# Patient Record
Sex: Female | Born: 2000 | Race: Black or African American | Hispanic: No | Marital: Single | Smoking: Never smoker
Health system: Southern US, Community
[De-identification: ages and names within clinical notes are randomized; demographics above are authoritative.]

## PROBLEM LIST (undated history)

## (undated) ENCOUNTER — Inpatient Hospital Stay (HOSPITAL_COMMUNITY): Payer: Self-pay

## (undated) DIAGNOSIS — H539 Unspecified visual disturbance: Secondary | ICD-10-CM

## (undated) DIAGNOSIS — F913 Oppositional defiant disorder: Secondary | ICD-10-CM

## (undated) DIAGNOSIS — F909 Attention-deficit hyperactivity disorder, unspecified type: Secondary | ICD-10-CM

## (undated) DIAGNOSIS — T7840XA Allergy, unspecified, initial encounter: Secondary | ICD-10-CM

## (undated) HISTORY — PX: WISDOM TOOTH EXTRACTION: SHX21

---
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
## 2022-02-10 ENCOUNTER — Ambulatory Visit (INDEPENDENT_AMBULATORY_CARE_PROVIDER_SITE_OTHER): Payer: Medicaid Other | Admitting: Pediatrics

## 2022-02-10 ENCOUNTER — Encounter: Payer: Self-pay | Admitting: Pediatrics

## 2022-02-10 VITALS — Wt 130.6 lb

## 2022-02-10 DIAGNOSIS — Z7187 Encounter for pediatric-to-adult transition counseling: Secondary | ICD-10-CM

## 2022-02-10 DIAGNOSIS — Z111 Encounter for screening for respiratory tuberculosis: Secondary | ICD-10-CM

## 2022-02-10 DIAGNOSIS — Z7189 Other specified counseling: Secondary | ICD-10-CM
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
## 2022-02-10 NOTE — Patient Instructions (Addendum)
Come back Monday morning to read PPD result.  Please select a new doctor in adult primary care for your upcoming check-ups and health needs.  Here is a list for guidance; however, other doctors may be available if you call them.  Please call the new doctor within the next 30 days to set up appointment; we will follow up with you to see if this has gone well.  Adult Primary Care Clinics Name Criteria Services   [facility]  Address: [address] [place], [place] [address]  Phone: [phone] Hours: Monday - Friday 9 AM -6 PM    Not currently taking new Patients, due to move into [employer], expected new patient acceptance time March/April 2023  Types of insurance accepted:  Commercial insurance [place] [employer] (orange card) [employer] Uninsured  Language services:  Video and phone interpreters available   Ages 45 and older    Adult primary care Onsite pharmacy Integrated behavioral health Financial assistance counseling Walk-in hours for established patients  Financial assistance counseling hours: Tuesdays 2:00PM - 5:00PM  Thursday 8:30AM - 4:30PM  Space is limited, 10 on Tuesday and 20 on Thursday. It's on first come first serve basis  Name Criteria Services   [facility]  Address: [address] [place], [place] [address]  Phone: [phone]  Hours: Monday - Friday 8:30 AM - 5 PM  Types of insurance accepted:  Commercial insurance Medicaid Medicare Uninsured  Language services:  Video and phone interpreters available   All ages - newborn to adult   Primary care for all ages (children and adults) Integrated behavioral health Nutritionist Financial assistance counseling   Name Criteria Services   [facility]  Located on the ground floor of [facility]  Address: [address]  [place],  [place]  [address]  Phone:  [phone]  Hours: Monday - Friday 8:15 AM - 5 PM  Types of insurance accepted:  Commercial insurance Medicaid Medicare Uninsured  Language services:  Video and phone interpreters available   Ages 90 and older   Adult primary care Nutritionist Certified Diabetes Educator  Integrated behavioral health Financial assistance counseling   Name Criteria Services   [facility]  Address: [address] [place], [place] [address]  Phone: [phone]  Hours: Monday - Friday 8:30 AM - 5 PM    Types of insurance accepted:  Nurse, learning disability Medicaid Medicare Uninsured  Language services:  Video and phone interpreters available   All ages - newborn to adult   Primary care for all ages (children and adults) Integrated behavioral health Financial assistance counseling

## 2022-02-13 ENCOUNTER — Ambulatory Visit (INDEPENDENT_AMBULATORY_CARE_PROVIDER_SITE_OTHER): Payer: Medicaid Other | Admitting: Pediatrics

## 2022-02-13 ENCOUNTER — Telehealth: Payer: Self-pay

## 2022-02-13 DIAGNOSIS — Z111 Encounter for screening for respiratory tuberculosis: Secondary | ICD-10-CM | POA: Diagnosis not present

## 2022-02-13 DIAGNOSIS — Z09 Encounter for follow-up examination after completed treatment for conditions other than malignant neoplasm: Secondary | ICD-10-CM

## 2022-02-13 LAB — TB SKIN TEST
Induration: 0 mm
TB Skin Test: NEGATIVE

## 2022-02-13 NOTE — Patient Instructions (Signed)
Please remember to contact Family Medicine for an appointment.

## 2022-02-13 NOTE — Telephone Encounter (Signed)
[facility] mailed pt Adolescent Transition notification letter. Pt can call SWCM if needing assistance, will f/u in 30 days.    Kenn File, BSW, QP Social Work Case Interior and spatial designer and [employer] [facility] Office: [phone] Direct Number: [phone]

## 2022-02-18 ENCOUNTER — Encounter: Payer: Self-pay | Admitting: Pediatrics

## 2022-02-18 NOTE — Progress Notes (Signed)
    Patient ID: Connie Dominguez, female    DOB: 11/22/2000, 21 y.o.   MRN: [id number]  Subjective: Connie Dominguez presents today for ppd reading at approximately 71 hours. She is accompanied by her boyfriend. No other problems.  Objective: left forearm with 0 induration  Assessment and Plan: Negative PPD  No chest xray required  Letter done for pt's employer (correction done to date on letter released to pt in MyChart). Advised annual screening and prn.  Maree Erie, MD

## 2022-02-23 DIAGNOSIS — B3731 Acute candidiasis of vulva and vagina: Secondary | ICD-10-CM | POA: Diagnosis not present

## 2022-02-23 DIAGNOSIS — Z113 Encounter for screening for infections with a predominantly sexual mode of transmission: Secondary | ICD-10-CM | POA: Diagnosis not present

## 2022-03-16 ENCOUNTER — Ambulatory Visit: Payer: Medicaid Other | Admitting: Pediatrics

## 2022-03-20 ENCOUNTER — Telehealth: Payer: Self-pay

## 2022-03-20 DIAGNOSIS — Z09 Encounter for follow-up examination after completed treatment for conditions other than malignant neoplasm: Secondary | ICD-10-CM

## 2022-03-20 NOTE — Telephone Encounter (Signed)
SWCM called pt for f/u on adolescent transition and scheduled a new pt appt with an adult PCP. No Answer. Left message for call back if support is needed. Pt will be marked dismissed from practice due to age and 3 attempts to contact.     Kenn File, BSW, QP Social Work Case Interior and spatial designer and [employer] [facility] Office: [phone] Direct Number: [phone]

## 2022-06-24 DIAGNOSIS — T7421XA Adult sexual abuse, confirmed, initial encounter: Secondary | ICD-10-CM | POA: Diagnosis not present

## 2022-06-30 ENCOUNTER — Ambulatory Visit (INDEPENDENT_AMBULATORY_CARE_PROVIDER_SITE_OTHER): Payer: Medicaid Other

## 2022-06-30 ENCOUNTER — Ambulatory Visit (HOSPITAL_COMMUNITY)
Admission: EM | Admit: 2022-06-30 | Discharge: 2022-06-30 | Disposition: A | Discharge status: Home / Self Care | Payer: Medicaid Other

## 2022-06-30 ENCOUNTER — Encounter (HOSPITAL_COMMUNITY): Payer: Self-pay | Admitting: *Deleted

## 2022-06-30 DIAGNOSIS — M25562 Pain in left knee: Secondary | ICD-10-CM | POA: Diagnosis not present

## 2022-06-30 DIAGNOSIS — M545 Low back pain, unspecified: Secondary | ICD-10-CM

## 2022-06-30 MED ORDER — IBUPROFEN 800 MG PO TABS
ORAL_TABLET | ORAL | Status: AC
  Filled 2022-06-30: qty 1

## 2022-06-30 MED ORDER — IBUPROFEN 800 MG PO TABS
800.0000 mg | ORAL_TABLET | Freq: Once | ORAL | Status: AC
  Administered 2022-06-30: 800 mg via ORAL

## 2022-06-30 MED ORDER — CYCLOBENZAPRINE HCL 10 MG PO TABS: 10.0000 mg | ORAL_TABLET | Freq: Two times a day (BID) | ORAL | 0 refills | Status: DC | PRN
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
## 2022-06-30 NOTE — Discharge Instructions (Signed)
You have been evaluated in the today for your low back pain. Your pain is most likely muscle strain which will improve on its own with time.   Take ibuprofen 600 mg with food every 6 hours for the next 2-3 days consistently to treat pain and inflammation, then as needed. Do not take any other NSAID containing medicine when taking ibuprofen like naproxen, Advil, Aleve, motrin, aspirin, Voltaren, diclofenac, and other medicines. Take this medication with a snack to avoid stomach upset.   You may also take Flexeril muscle relaxer every 12 hours as needed for muscle spasm.  Do not take this medication and drive or drink alcohol as it can make you sleepy.  Mainly use this medicine at nighttime as needed.  Apply heat and perform gentle range of motion exercises to the area of greatest pain to prevent muscle stiffness and provide further pain relief.   Follow-up with your primary care provider or return to urgent care if your symptoms do not improve in the next 3 to 4 days with medications and interventions recommended today.  If you develop any new or worsening symptoms, please return to urgent care.  If your symptoms are severe, please go to the emergency room.  I hope you feel better!

## 2022-07-11 DIAGNOSIS — Z113 Encounter for screening for infections with a predominantly sexual mode of transmission: Secondary | ICD-10-CM | POA: Diagnosis not present

## 2022-07-16 DIAGNOSIS — H5213 Myopia, bilateral: Secondary | ICD-10-CM | POA: Diagnosis not present

## 2022-08-25 DIAGNOSIS — Z0000 Encounter for general adult medical examination without abnormal findings: Secondary | ICD-10-CM | POA: Diagnosis not present

## 2022-08-25 DIAGNOSIS — T7421XD Adult sexual abuse, confirmed, subsequent encounter: Secondary | ICD-10-CM | POA: Diagnosis not present

## 2022-08-25 DIAGNOSIS — Z113 Encounter for screening for infections with a predominantly sexual mode of transmission: Secondary | ICD-10-CM | POA: Diagnosis not present

## 2022-08-25 DIAGNOSIS — F329 Major depressive disorder, single episode, unspecified: Secondary | ICD-10-CM | POA: Diagnosis not present

## 2022-09-01 ENCOUNTER — Ambulatory Visit (HOSPITAL_COMMUNITY)
Admission: EM | Admit: 2022-09-01 | Discharge: 2022-09-01 | Disposition: A | Discharge status: Home / Self Care | Payer: Medicaid Other

## 2022-09-01 ENCOUNTER — Encounter (HOSPITAL_COMMUNITY): Payer: Self-pay | Admitting: *Deleted

## 2022-09-01 DIAGNOSIS — Z709 Sex counseling, unspecified: Secondary | ICD-10-CM | POA: Diagnosis not present

## 2022-09-01 DIAGNOSIS — Z711 Person with feared health complaint in whom no diagnosis is made: Secondary | ICD-10-CM
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
## 2022-09-01 NOTE — Discharge Instructions (Addendum)
Please call us or return with any questions or concerns :)
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
## 2022-09-29 DIAGNOSIS — F4311 Post-traumatic stress disorder, acute: Secondary | ICD-10-CM | POA: Diagnosis not present
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
## 2022-11-17 ENCOUNTER — Ambulatory Visit: Payer: Medicaid Other | Admitting: Surgical

## 2022-12-25 ENCOUNTER — Emergency Department (HOSPITAL_COMMUNITY)
Admission: EM | Admit: 2022-12-25 | Discharge: 2022-12-25 | Discharge status: Against Medical Advice | Payer: 59 | Attending: Emergency Medicine | Admitting: Emergency Medicine

## 2022-12-25 ENCOUNTER — Encounter (HOSPITAL_COMMUNITY): Payer: Self-pay

## 2022-12-25 ENCOUNTER — Other Ambulatory Visit: Payer: Self-pay

## 2022-12-25 DIAGNOSIS — S99922A Unspecified injury of left foot, initial encounter: Secondary | ICD-10-CM | POA: Diagnosis present

## 2022-12-25 DIAGNOSIS — W208XXA Other cause of strike by thrown, projected or falling object, initial encounter: Secondary | ICD-10-CM | POA: Diagnosis not present

## 2022-12-25 DIAGNOSIS — Z5321 Procedure and treatment not carried out due to patient leaving prior to being seen by health care provider: Secondary | ICD-10-CM | POA: Insufficient documentation

## 2022-12-25 NOTE — ED Notes (Signed)
Called for pt two times without response
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
## 2022-12-25 NOTE — ED Notes (Signed)
Patient no longer in waiting room. Has been called for twice with no response.

## 2022-12-26 ENCOUNTER — Ambulatory Visit (HOSPITAL_COMMUNITY)
Admission: EM | Admit: 2022-12-26 | Discharge: 2022-12-26 | Disposition: A | Discharge status: Home / Self Care | Payer: 59 | Attending: Emergency Medicine | Admitting: Emergency Medicine

## 2022-12-26 ENCOUNTER — Ambulatory Visit (INDEPENDENT_AMBULATORY_CARE_PROVIDER_SITE_OTHER): Payer: 59

## 2022-12-26 DIAGNOSIS — L089 Local infection of the skin and subcutaneous tissue, unspecified: Secondary | ICD-10-CM

## 2022-12-26 DIAGNOSIS — T148XXA Other injury of unspecified body region, initial encounter: Secondary | ICD-10-CM

## 2022-12-26 MED ORDER — CHLORHEXIDINE GLUCONATE 4 % EX SOLN: Freq: Every day | CUTANEOUS | 0 refills | Status: DC | PRN

## 2022-12-26 MED ORDER — CEPHALEXIN 500 MG PO CAPS: 1000.0000 mg | ORAL_CAPSULE | Freq: Two times a day (BID) | ORAL | 0 refills | Status: AC

## 2022-12-26 NOTE — Discharge Instructions (Signed)
I will contact you if and only if your x-ray comes back different than my read and we need to change management.  I did not see a fracture or bone infection.  Finish the Keflex, even if you feel better.  Apply antibacterial ointment such as bacitracin to the area after cleaning it with chlorhexidine soap and water.  Wear a sock and close toe shoe until this heals.  Follow-up with your primary care provider.

## 2022-12-26 NOTE — ED Provider Notes (Signed)
HPI  SUBJECTIVE:  Connie Dominguez is a 22 y.o. female who presents with dull, throbbing, constant pain, swelling, purulent drainage and odor coming from a laceration at the base of the nail of her left first toe.  She reports limitation of motion of the toe secondary to the pain, swelling.  She states that a metal table fell onto her toe.  No erythema, fevers, foot swelling.  She tried Tylenol, A&D ointment and keeping it dressed with a Band-Aid.  Tylenol helps.  No aggravating factors.  No antipyretic in the past 6 hours.  Past medical history negative for MRSA.  LMP: 6/10.  Denies the possibility of being pregnant.  PCP: In [facility].    Past Medical History:  Diagnosis Date   Acute pain of left knee 05/21/2017   ADHD (attention deficit hyperactivity disorder)    Allergy    ODD (oppositional defiant disorder)    Vision abnormalities    wears glasses    Past Surgical History:  Procedure Laterality Date   ANTERIOR CRUCIATE LIGAMENT REPAIR Left 06/28/2017   Procedure: LEFT KNEE ANTERIOR CRUCIATE LIGAMENT (ACL) RECONSTRUCTION WITH HAMSTRING AUTOGRAFT, MEDIAL MENISCAL REPAIR;  Surgeon: Cammy Copa, MD;  Location: [facility] OR;  Service: Orthopedics;  Laterality: Left;   WISDOM TOOTH EXTRACTION      Family History  Problem Relation Age of Onset   Cerebral palsy Mother     Social History   Tobacco Use   Smoking status: Never   Smokeless tobacco: Never  Vaping Use   Vaping Use: Every day  Substance Use Topics   Alcohol use: Yes    Alcohol/week: 21.0 standard drinks of alcohol    Types: 21 Shots of liquor per week   Drug use: Yes    Types: Marijuana    No current facility-administered medications for this encounter.  Current Outpatient Medications:    cephALEXin (KEFLEX) 500 MG capsule, Take 2 capsules (1,000 mg total) by mouth 2 (two) times daily for 7 days., Disp: 28 capsule, Rfl: 0   chlorhexidine (HIBICLENS) 4 % external liquid, Apply topically daily as needed., Disp:  120 mL, Rfl: 0  No Known Allergies   ROS  As noted in HPI.   Physical Exam  BP 121/61 (BP Location: Right Arm)   Pulse (!) 57   Temp 98 F (36.7 C) (Oral)   Resp 16   SpO2 98%   Constitutional: Well developed, well nourished, no acute distress Eyes:  EOMI, conjunctiva normal bilaterally HENT: Normocephalic, atraumatic,mucus membranes moist Respiratory: Normal inspiratory effort Cardiovascular: Normal rate GI: nondistended skin: No rash, skin intact Musculoskeletal: Tenderness at the base of the left first toenail with pus underneath the nail.  Positive expressible purulent drainage.  No other tenderness over the toe.  Nail seems to be firmly attached.  Positive localized erythema.  No induration, surrounding erythema.      Neurologic: Alert & oriented x 3, no focal neuro deficits Psychiatric: Speech and behavior appropriate   ED Course   Medications - No data to display  Orders Placed This Encounter  Procedures   DG Toe Great Left    Standing Status:   Standing    Number of Occurrences:   1    Order Specific Question:   Reason for Exam (SYMPTOM  OR DIAGNOSIS REQUIRED)    Answer:   direct trauma 2 weeks ago, infection. r/o fx, osteomyelitis    No results found for this or any previous visit (from the past 24 hour(s)). DG Toe Murphy Oil  Left  Result Date: 12/26/2022 CLINICAL DATA:  Left great toe wound. EXAM: LEFT GREAT TOE COMPARISON:  None Available. FINDINGS: There is no evidence of fracture or dislocation. There is no evidence of arthropathy or other focal bone abnormality. Soft tissues are unremarkable. IMPRESSION: Negative. Electronically Signed   By: Lupita Raider M.D.   On: 12/26/2022 12:49    ED Clinical Impression  1. Infected laceration   2. Toe infection      ED Assessment/Plan     Patient with paronychia after having direct trauma to the toe 2 weeks ago.  Will x-ray toe to rule out osteomyelitis.  I am able to express purulent drainage, so do  not think that doing an I&D is necessary.  Will send home with Keflex 1000 mg p.o. twice daily for a week, chlorhexidine soap, she is to continue antibacterial ointment, wear sock and shoe to protect it while healing.  Follow-up with PCP if not better after finishing antibiotics.  Reviewed imaging independently.  No fracture, no osteomyelitis as read by me.  Final radiology report pending at the time of discharge.    No fracture, osteomyelitis per radiology.  See radiology report for details.  Discussed imaging, MDM, treatment plan, and plan for follow-up with patient. Discussed sn/sx that should prompt return to the ED. patient agrees with plan.   Meds ordered this encounter  Medications   cephALEXin (KEFLEX) 500 MG capsule    Sig: Take 2 capsules (1,000 mg total) by mouth 2 (two) times daily for 7 days.    Dispense:  28 capsule    Refill:  0   chlorhexidine (HIBICLENS) 4 % external liquid    Sig: Apply topically daily as needed.    Dispense:  120 mL    Refill:  0      *This clinic note was created using Scientist, clinical (histocompatibility and immunogenetics). Therefore, there may be occasional mistakes despite careful proofreading.  ? Domenick Gong, MD 12/26/22 1554

## 2022-12-26 NOTE — ED Triage Notes (Signed)
Pt c/o left great toe nail wound since last Thursday, pt endorsed a metal table fell on her great toe and wound is getting worse.

## 2023-02-12 DIAGNOSIS — N760 Acute vaginitis: Secondary | ICD-10-CM | POA: Diagnosis not present

## 2023-02-12 DIAGNOSIS — B3731 Acute candidiasis of vulva and vagina: Secondary | ICD-10-CM | POA: Diagnosis not present

## 2023-02-12 DIAGNOSIS — Z113 Encounter for screening for infections with a predominantly sexual mode of transmission: Secondary | ICD-10-CM | POA: Diagnosis not present

## 2023-05-15 DIAGNOSIS — G8929 Other chronic pain: Secondary | ICD-10-CM | POA: Diagnosis not present

## 2023-05-15 DIAGNOSIS — M25562 Pain in left knee: Secondary | ICD-10-CM | POA: Diagnosis not present

## 2023-05-28 DIAGNOSIS — Z113 Encounter for screening for infections with a predominantly sexual mode of transmission: Secondary | ICD-10-CM | POA: Diagnosis not present

## 2023-05-28 DIAGNOSIS — Z3200 Encounter for pregnancy test, result unknown: Secondary | ICD-10-CM | POA: Diagnosis not present

## 2023-05-28 DIAGNOSIS — N760 Acute vaginitis: Secondary | ICD-10-CM | POA: Diagnosis not present

## 2023-05-28 DIAGNOSIS — B3731 Acute candidiasis of vulva and vagina: Secondary | ICD-10-CM | POA: Diagnosis not present

## 2023-05-28 DIAGNOSIS — Z114 Encounter for screening for human immunodeficiency virus [HIV]: Secondary | ICD-10-CM | POA: Diagnosis not present

## 2023-05-31 DIAGNOSIS — A564 Chlamydial infection of pharynx: Secondary | ICD-10-CM | POA: Diagnosis not present

## 2023-05-31 DIAGNOSIS — A5600 Chlamydial infection of lower genitourinary tract, unspecified: Secondary | ICD-10-CM | POA: Diagnosis not present

## 2023-07-25 DIAGNOSIS — A564 Chlamydial infection of pharynx: Secondary | ICD-10-CM | POA: Diagnosis not present

## 2023-07-25 DIAGNOSIS — Z3200 Encounter for pregnancy test, result unknown: Secondary | ICD-10-CM | POA: Diagnosis not present

## 2023-07-25 DIAGNOSIS — N760 Acute vaginitis: Secondary | ICD-10-CM | POA: Diagnosis not present

## 2023-07-25 DIAGNOSIS — Z114 Encounter for screening for human immunodeficiency virus [HIV]: Secondary | ICD-10-CM | POA: Diagnosis not present

## 2023-07-25 DIAGNOSIS — A5600 Chlamydial infection of lower genitourinary tract, unspecified: Secondary | ICD-10-CM | POA: Diagnosis not present

## 2023-07-25 DIAGNOSIS — Z113 Encounter for screening for infections with a predominantly sexual mode of transmission: Secondary | ICD-10-CM | POA: Diagnosis not present

## 2023-07-25 DIAGNOSIS — B3731 Acute candidiasis of vulva and vagina: Secondary | ICD-10-CM | POA: Diagnosis not present

## 2023-08-08 DIAGNOSIS — N760 Acute vaginitis: Secondary | ICD-10-CM | POA: Diagnosis not present

## 2023-08-08 DIAGNOSIS — Z113 Encounter for screening for infections with a predominantly sexual mode of transmission: Secondary | ICD-10-CM | POA: Diagnosis not present

## 2023-09-04 DIAGNOSIS — H538 Other visual disturbances: Secondary | ICD-10-CM | POA: Diagnosis not present

## 2023-09-11 DIAGNOSIS — J029 Acute pharyngitis, unspecified: Secondary | ICD-10-CM | POA: Diagnosis not present

## 2023-10-05 DIAGNOSIS — Z202 Contact with and (suspected) exposure to infections with a predominantly sexual mode of transmission: Secondary | ICD-10-CM | POA: Diagnosis not present

## 2023-10-05 DIAGNOSIS — N898 Other specified noninflammatory disorders of vagina: Secondary | ICD-10-CM | POA: Diagnosis not present

## 2023-10-05 DIAGNOSIS — R399 Unspecified symptoms and signs involving the genitourinary system: Secondary | ICD-10-CM | POA: Diagnosis not present

## 2023-10-10 DIAGNOSIS — A549 Gonococcal infection, unspecified: Secondary | ICD-10-CM | POA: Diagnosis not present

## 2023-11-02 DIAGNOSIS — B3731 Acute candidiasis of vulva and vagina: Secondary | ICD-10-CM | POA: Diagnosis not present

## 2023-11-02 DIAGNOSIS — Z113 Encounter for screening for infections with a predominantly sexual mode of transmission: Secondary | ICD-10-CM | POA: Diagnosis not present

## 2023-11-02 DIAGNOSIS — Z114 Encounter for screening for human immunodeficiency virus [HIV]: Secondary | ICD-10-CM | POA: Diagnosis not present

## 2023-12-03 NOTE — Telephone Encounter (Signed)
 Doxy sent by provider at [facility]. Please fax dept of health form and notify patient of results and plan as well as recommending he notify his partner(s) to be evaluated and treated for potential exposure to STD. Advise safer sex with consistent condom use to  prevent STD exposure in the future.

## 2023-12-04 NOTE — Telephone Encounter (Signed)
 Health Dept form completed per encounter screen

## 2024-01-02 DIAGNOSIS — F4310 Post-traumatic stress disorder, unspecified: Secondary | ICD-10-CM | POA: Diagnosis not present

## 2024-01-02 DIAGNOSIS — F411 Generalized anxiety disorder: Secondary | ICD-10-CM | POA: Diagnosis not present

## 2024-01-02 DIAGNOSIS — F909 Attention-deficit hyperactivity disorder, unspecified type: Secondary | ICD-10-CM | POA: Diagnosis not present

## 2024-01-02 DIAGNOSIS — F332 Major depressive disorder, recurrent severe without psychotic features: Secondary | ICD-10-CM | POA: Diagnosis not present

## 2024-01-23 DIAGNOSIS — F411 Generalized anxiety disorder: Secondary | ICD-10-CM | POA: Diagnosis not present

## 2024-01-23 DIAGNOSIS — F332 Major depressive disorder, recurrent severe without psychotic features: Secondary | ICD-10-CM | POA: Diagnosis not present

## 2024-01-23 DIAGNOSIS — F4310 Post-traumatic stress disorder, unspecified: Secondary | ICD-10-CM | POA: Diagnosis not present

## 2024-01-23 DIAGNOSIS — F909 Attention-deficit hyperactivity disorder, unspecified type: Secondary | ICD-10-CM | POA: Diagnosis not present

## 2024-01-24 DIAGNOSIS — Z113 Encounter for screening for infections with a predominantly sexual mode of transmission: Secondary | ICD-10-CM | POA: Diagnosis not present

## 2024-01-24 DIAGNOSIS — Z114 Encounter for screening for human immunodeficiency virus [HIV]: Secondary | ICD-10-CM | POA: Diagnosis not present

## 2024-01-29 DIAGNOSIS — A5400 Gonococcal infection of lower genitourinary tract, unspecified: Secondary | ICD-10-CM | POA: Diagnosis not present

## 2024-02-18 DIAGNOSIS — F332 Major depressive disorder, recurrent severe without psychotic features: Secondary | ICD-10-CM | POA: Diagnosis not present

## 2024-02-18 DIAGNOSIS — F411 Generalized anxiety disorder: Secondary | ICD-10-CM | POA: Diagnosis not present

## 2024-02-18 DIAGNOSIS — F4310 Post-traumatic stress disorder, unspecified: Secondary | ICD-10-CM | POA: Diagnosis not present

## 2024-02-18 DIAGNOSIS — F909 Attention-deficit hyperactivity disorder, unspecified type: Secondary | ICD-10-CM | POA: Diagnosis not present

## 2024-02-25 DIAGNOSIS — N898 Other specified noninflammatory disorders of vagina: Secondary | ICD-10-CM | POA: Diagnosis not present

## 2024-02-25 DIAGNOSIS — Z113 Encounter for screening for infections with a predominantly sexual mode of transmission: Secondary | ICD-10-CM | POA: Diagnosis not present

## 2024-03-03 ENCOUNTER — Ambulatory Visit: Admitting: Orthopedic Surgery

## 2024-03-11 ENCOUNTER — Ambulatory Visit: Admitting: Orthopedic Surgery

## 2024-03-21 DIAGNOSIS — Z113 Encounter for screening for infections with a predominantly sexual mode of transmission: Secondary | ICD-10-CM | POA: Diagnosis not present

## 2024-03-21 DIAGNOSIS — N760 Acute vaginitis: Secondary | ICD-10-CM | POA: Diagnosis not present

## 2024-03-21 DIAGNOSIS — N898 Other specified noninflammatory disorders of vagina: Secondary | ICD-10-CM | POA: Diagnosis not present

## 2024-03-24 ENCOUNTER — Ambulatory Visit: Admitting: Orthopedic Surgery

## 2024-03-31 DIAGNOSIS — Z59819 Housing instability, housed unspecified: Secondary | ICD-10-CM | POA: Diagnosis not present

## 2024-03-31 DIAGNOSIS — Z3169 Encounter for other general counseling and advice on procreation: Secondary | ICD-10-CM | POA: Diagnosis not present

## 2024-04-02 ENCOUNTER — Ambulatory Visit: Admitting: Physician Assistant

## 2024-04-03 ENCOUNTER — Ambulatory Visit (INDEPENDENT_AMBULATORY_CARE_PROVIDER_SITE_OTHER): Admitting: Physician Assistant

## 2024-04-03 ENCOUNTER — Other Ambulatory Visit: Payer: Self-pay

## 2024-04-03 ENCOUNTER — Ambulatory Visit: Admitting: Physician Assistant

## 2024-04-03 DIAGNOSIS — G8929 Other chronic pain: Secondary | ICD-10-CM

## 2024-04-03 DIAGNOSIS — M25562 Pain in left knee: Secondary | ICD-10-CM | POA: Diagnosis not present

## 2024-04-03 NOTE — Addendum Note (Signed)
 Addended by: WELLS, LYDIA E on: 04/03/2024 02:44 PM   Modules accepted: Orders

## 2024-04-03 NOTE — Progress Notes (Signed)
 Office Visit Note   Patient: Connie Dominguez           Date of Birth: 02-20-2001           MRN: [id number] Visit Date: 04/03/2024              Requested by: No referring provider defined for this encounter. PCP: Pcp, No   Assessment & Plan: Visit Diagnoses:  1. Chronic pain of left knee     Plan: Patient is a pleasant 23 year old woman who goes to [employer] and works.  She comes in today with chief complaint of occasional giving way of her left knee and pain.  This has been going on for about 3 months no history of injury no particular pattern.  She does like to walk a lot she does not say she has any popping or clicking she no longer does any PT exercises.  She did have an ACL reconstruction with hamstring graft and medial meniscus repair when she was 16 with Dr. Addie.  Her exam is fairly benign today.  I recommend that I will refer her to physical therapy she can get a home exercise program from them and do this on her own if she needs to.  Would like her to schedule a follow-up in about a month to 6 weeks with Dr. Addie  Follow-Up Instructions: Return in about 6 weeks (around 05/15/2024).   Orders:  Orders Placed This Encounter  Procedures   XR KNEE 3 VIEW LEFT   No orders of the defined types were placed in this encounter.     Procedures: No procedures performed   Clinical Data: No additional findings.   Subjective: Chief Complaint  Patient presents with   Left Knee - Pain    HPI pleasant 23 year old woman comes in today with left knee pain that comes and goes.  Finds it random but when it happens her knee gives way and it is fairly painful.  She is status post ACL reconstruction and medial meniscus with pair with Dr. Addie about 6 years ago when she was 16.  She does not have any new injury  Review of Systems  All other systems reviewed and are negative.    Objective: Vital Signs: There were no vitals taken for this visit.  Physical Exam Constitutional:       Appearance: Normal appearance.  Pulmonary:     Effort: Pulmonary effort is normal.  Skin:    General: Skin is warm and dry.  Neurological:     General: No focal deficit present.     Mental Status: She is alert and oriented to person, place, and time.  Psychiatric:        Mood and Affect: Mood normal.        Behavior: Behavior normal.     Ortho Exam Examination of her left knee she has well-healed surgical incisions.  Compartments are soft and compressible she is neurovascular intact she has good extension and flexion.  She has similar endpoint on anterior draw that she does to her right knee.  No tenderness over the medial or lateral joint line cannot necessarily reproduce any of her popping good varus valgus stability Specialty Comments:  No specialty comments available.  Imaging: No results found.   PMFS History: Patient Active Problem List   Diagnosis Date Noted   Tears of meniscus and ACL of left knee, subsequent encounter 06/07/2017   Other seasonal allergic rhinitis 04/15/2014   Failed hearing screening 04/15/2014  Constipation 04/15/2014   ADHD (attention deficit hyperactivity disorder), combined type 12/13/2012   Learning disability 12/13/2012   Adjustment disorder with mixed anxiety and depressed mood 12/13/2012   Past Medical History:  Diagnosis Date   Acute pain of left knee 05/21/2017   ADHD (attention deficit hyperactivity disorder)    Allergy    ODD (oppositional defiant disorder)    Vision abnormalities    wears glasses    Family History  Problem Relation Age of Onset   Cerebral palsy Mother     Past Surgical History:  Procedure Laterality Date   ANTERIOR CRUCIATE LIGAMENT REPAIR Left 06/28/2017   Procedure: LEFT KNEE ANTERIOR CRUCIATE LIGAMENT (ACL) RECONSTRUCTION WITH HAMSTRING AUTOGRAFT, MEDIAL MENISCAL REPAIR;  Surgeon: Addie Cordella Hamilton, MD;  Location: [facility] OR;  Service: Orthopedics;  Laterality: Left;   WISDOM TOOTH EXTRACTION      Social History   Occupational History   Not on file  Tobacco Use   Smoking status: Never   Smokeless tobacco: Never  Vaping Use   Vaping status: Every Day  Substance and Sexual Activity   Alcohol use: Yes    Alcohol/week: 21.0 standard drinks of alcohol    Types: 21 Shots of liquor per week   Drug use: Yes    Types: Marijuana   Sexual activity: Yes    Birth control/protection: None

## 2024-04-03 NOTE — Progress Notes (Signed)
   Office Visit Note   Patient: Connie Dominguez           Date of Birth: 07/09/2001           MRN: [id number] Visit Date: 04/03/2024              Requested by: No referring provider defined for this encounter. PCP: Pcp, No   Assessment & Plan: Visit Diagnoses:  1. Chronic pain of left knee     Plan:   Follow-Up Instructions: No follow-ups on file.   Orders:  Orders Placed This Encounter  Procedures   XR KNEE 3 VIEW LEFT   No orders of the defined types were placed in this encounter.     Procedures: No procedures performed   Clinical Data: No additional findings.   Subjective: Chief Complaint  Patient presents with   Left Knee - Pain    HPI  Review of Systems   Objective: Vital Signs: There were no vitals taken for this visit.  Physical Exam  Ortho Exam  Specialty Comments:  No specialty comments available.  Imaging: No results found.   PMFS History: Patient Active Problem List   Diagnosis Date Noted   Tears of meniscus and ACL of left knee, subsequent encounter 06/07/2017   Other seasonal allergic rhinitis 04/15/2014   Failed hearing screening 04/15/2014   Constipation 04/15/2014   ADHD (attention deficit hyperactivity disorder), combined type 12/13/2012   Learning disability 12/13/2012   Adjustment disorder with mixed anxiety and depressed mood 12/13/2012   Past Medical History:  Diagnosis Date   Acute pain of left knee 05/21/2017   ADHD (attention deficit hyperactivity disorder)    Allergy    ODD (oppositional defiant disorder)    Vision abnormalities    wears glasses    Family History  Problem Relation Age of Onset   Cerebral palsy Mother     Past Surgical History:  Procedure Laterality Date   ANTERIOR CRUCIATE LIGAMENT REPAIR Left 06/28/2017   Procedure: LEFT KNEE ANTERIOR CRUCIATE LIGAMENT (ACL) RECONSTRUCTION WITH HAMSTRING AUTOGRAFT, MEDIAL MENISCAL REPAIR;  Surgeon: Addie Cordella Hamilton, MD;  Location: [facility] OR;  Service:  Orthopedics;  Laterality: Left;   WISDOM TOOTH EXTRACTION     Social History   Occupational History   Not on file  Tobacco Use   Smoking status: Never   Smokeless tobacco: Never  Vaping Use   Vaping status: Every Day  Substance and Sexual Activity   Alcohol use: Yes    Alcohol/week: 21.0 standard drinks of alcohol    Types: 21 Shots of liquor per week   Drug use: Yes    Types: Marijuana   Sexual activity: Yes    Birth control/protection: None

## 2024-04-15 NOTE — Therapy (Incomplete)
 OUTPATIENT PHYSICAL THERAPY LOWER EXTREMITY EVALUATION   Patient Name: Connie Dominguez MRN: [id number] DOB:03-07-2001, 23 y.o., female Today's Date: 04/15/2024  END OF SESSION:   Past Medical History:  Diagnosis Date   Acute pain of left knee 05/21/2017   ADHD (attention deficit hyperactivity disorder)    Allergy    ODD (oppositional defiant disorder)    Vision abnormalities    wears glasses   Past Surgical History:  Procedure Laterality Date   ANTERIOR CRUCIATE LIGAMENT REPAIR Left 06/28/2017   Procedure: LEFT KNEE ANTERIOR CRUCIATE LIGAMENT (ACL) RECONSTRUCTION WITH HAMSTRING AUTOGRAFT, MEDIAL MENISCAL REPAIR;  Surgeon: Addie Cordella Hamilton, MD;  Location: [facility] OR;  Service: Orthopedics;  Laterality: Left;   WISDOM TOOTH EXTRACTION     Patient Active Problem List   Diagnosis Date Noted   Tears of meniscus and ACL of left knee, subsequent encounter 06/07/2017   Other seasonal allergic rhinitis 04/15/2014   Failed hearing screening 04/15/2014   Constipation 04/15/2014   ADHD (attention deficit hyperactivity disorder), combined type 12/13/2012   Learning disability 12/13/2012   Adjustment disorder with mixed anxiety and depressed mood 12/13/2012    PCP: PCP  REFERRING PROVIDER: Persons, Ronal Dragon, PA   REFERRING DIAG: [phone] (ICD-10-CM) - Chronic pain of left knee   THERAPY DIAG:  No diagnosis found.  Rationale for Evaluation and Treatment: Rehabilitation  ONSET DATE: ***  SUBJECTIVE:   SUBJECTIVE STATEMENT: ***  PERTINENT HISTORY: *** PAIN:  Are you having pain? Yes: NPRS scale: *** Pain location: *** Pain description: *** Aggravating factors: *** Relieving factors: ***  PRECAUTIONS: {Therapy precautions:24002}  RED FLAGS: {PT Red Flags:29287}   WEIGHT BEARING RESTRICTIONS: {Yes ***/No:24003}  FALLS:  Has patient fallen in last 6 months? {fallsyesno:27318}  LIVING ENVIRONMENT: Lives with: {[facility] lives with:25569::lives with their  family} Lives in: {Lives in:25570} Stairs: {opstairs:27293} Has following equipment at home: {Assistive devices:23999}  OCCUPATION: ***  PLOF: {PLOF:24004}  PATIENT GOALS: ***  NEXT MD VISIT: ***  OBJECTIVE:  Note: Objective measures were completed at Evaluation unless otherwise noted.  DIAGNOSTIC FINDINGS:  06/30/24 FINDINGS: Frontal, bilateral oblique, lateral views of the left knee demonstrate postsurgical changes from prior ACL repair, stable. No acute fracture, subluxation, or dislocation. Mild medial compartmental joint space narrowing. No joint effusion. Soft tissues are unremarkable.   IMPRESSION: 1. No acute bony abnormality. 2. Stable postsurgical changes.  PATIENT SURVEYS:  {rehab surveys:24030}  COGNITION: Overall cognitive status: {cognition:24006}     SENSATION: {sensation:27233}  EDEMA:  {edema:24020}  MUSCLE LENGTH: Hamstrings: Right *** deg; Left *** deg Debby test: Right *** deg; Left *** deg  POSTURE: {posture:25561}  PALPATION: ***  LOWER EXTREMITY ROM:  {AROM/PROM:27142} ROM Right eval Left eval  Hip flexion    Hip extension    Hip abduction    Hip adduction    Hip internal rotation    Hip external rotation    Knee flexion    Knee extension    Ankle dorsiflexion    Ankle plantarflexion    Ankle inversion    Ankle eversion     (Blank rows = not tested)  LOWER EXTREMITY MMT:  MMT Right eval Left eval  Hip flexion    Hip extension    Hip abduction    Hip adduction    Hip internal rotation    Hip external rotation    Knee flexion    Knee extension    Ankle dorsiflexion    Ankle plantarflexion    Ankle inversion    Ankle eversion     (  Blank rows = not tested)  LOWER EXTREMITY SPECIAL TESTS:  {LEspecialtests:26242}  FUNCTIONAL TESTS:  {Functional tests:24029}  GAIT: Distance walked: *** Assistive device utilized: {Assistive devices:23999} Level of assistance: {Levels of assistance:24026} Comments:  ***                                                                                                                                TREATMENT DATE:  [facility] Adult PT Treatment:                                                DATE: 04/17/24 Therapeutic Exercise: *** Manual Therapy: *** Neuromuscular re-ed: *** Therapeutic Activity: *** Modalities: *** Self Care: ***     PATIENT EDUCATION:  Education details: *** Person educated: {Person educated:25204} Education method: {Education Method:25205} Education comprehension: {Education Comprehension:25206}  HOME EXERCISE PROGRAM: ***  ASSESSMENT:  CLINICAL IMPRESSION: Patient is a 23 y.o. female who was seen today for physical therapy evaluation and treatment for M25.562,G89.29 (ICD-10-CM) - Chronic pain of left knee .   OBJECTIVE IMPAIRMENTS: {opptimpairments:25111}.   ACTIVITY LIMITATIONS: {activitylimitations:27494}  PARTICIPATION LIMITATIONS: {participationrestrictions:25113}  PERSONAL FACTORS: {Personal factors:25162} are also affecting patient's functional outcome.   REHAB POTENTIAL: {rehabpotential:25112}  CLINICAL DECISION MAKING: {clinical decision making:25114}  EVALUATION COMPLEXITY: {Evaluation complexity:25115}   GOALS:  SHORT TERM GOALS: Target date: *** *** Baseline: Goal status: INITIAL  2.  *** Baseline:  Goal status: INITIAL  3.  *** Baseline:  Goal status: INITIAL  4.  *** Baseline:  Goal status: INITIAL  5.  *** Baseline:  Goal status: INITIAL  6.  *** Baseline:  Goal status: INITIAL  LONG TERM GOALS: Target date: ***  *** Baseline:  Goal status: INITIAL  2.  *** Baseline:  Goal status: INITIAL  3.  *** Baseline:  Goal status: INITIAL  4.  *** Baseline:  Goal status: INITIAL  5.  *** Baseline:  Goal status: INITIAL  6.  *** Baseline:  Goal status: INITIAL   PLAN:  PT FREQUENCY: {rehab frequency:25116}  PT DURATION: {rehab duration:25117}  PLANNED  INTERVENTIONS: {rehab planned interventions:25118::97110-Therapeutic exercises,97530- Therapeutic [phone]- Neuromuscular re-education,97535- Self Rjmz,02859- Manual therapy,Patient/Family education}  PLAN FOR NEXT SESSION: ***   Zinedine Ellner, PT 04/15/2024, 12:29 PM

## 2024-04-17 ENCOUNTER — Ambulatory Visit: Attending: Physician Assistant

## 2024-04-28 ENCOUNTER — Ambulatory Visit: Admitting: Orthopedic Surgery

## 2024-05-12 ENCOUNTER — Encounter: Payer: Self-pay | Admitting: Radiology

## 2024-05-15 DIAGNOSIS — Z124 Encounter for screening for malignant neoplasm of cervix: Secondary | ICD-10-CM | POA: Diagnosis not present

## 2024-05-15 DIAGNOSIS — N898 Other specified noninflammatory disorders of vagina: Secondary | ICD-10-CM | POA: Diagnosis not present

## 2024-05-15 DIAGNOSIS — Z01419 Encounter for gynecological examination (general) (routine) without abnormal findings: Secondary | ICD-10-CM | POA: Diagnosis not present

## 2024-05-15 DIAGNOSIS — R350 Frequency of micturition: Secondary | ICD-10-CM | POA: Diagnosis not present

## 2024-05-21 DIAGNOSIS — F332 Major depressive disorder, recurrent severe without psychotic features: Secondary | ICD-10-CM | POA: Diagnosis not present

## 2024-05-21 DIAGNOSIS — F4310 Post-traumatic stress disorder, unspecified: Secondary | ICD-10-CM | POA: Diagnosis not present

## 2024-05-21 DIAGNOSIS — F909 Attention-deficit hyperactivity disorder, unspecified type: Secondary | ICD-10-CM | POA: Diagnosis not present

## 2024-05-21 DIAGNOSIS — F411 Generalized anxiety disorder: Secondary | ICD-10-CM | POA: Diagnosis not present

## 2024-05-26 DIAGNOSIS — Z113 Encounter for screening for infections with a predominantly sexual mode of transmission: Secondary | ICD-10-CM | POA: Diagnosis not present

## 2024-05-26 DIAGNOSIS — N898 Other specified noninflammatory disorders of vagina: Secondary | ICD-10-CM | POA: Diagnosis not present

## 2024-07-25 ENCOUNTER — Other Ambulatory Visit: Payer: Self-pay

## 2024-07-25 ENCOUNTER — Inpatient Hospital Stay (HOSPITAL_COMMUNITY)

## 2024-07-25 ENCOUNTER — Encounter (HOSPITAL_COMMUNITY): Payer: Self-pay | Admitting: Obstetrics and Gynecology

## 2024-07-25 ENCOUNTER — Inpatient Hospital Stay (HOSPITAL_COMMUNITY)
Admission: AD | Admit: 2024-07-25 | Discharge: 2024-07-25 | Disposition: A | Discharge status: Home / Self Care | Source: Home / Self Care | Attending: Obstetrics and Gynecology | Admitting: Obstetrics and Gynecology

## 2024-07-25 ENCOUNTER — Inpatient Hospital Stay (HOSPITAL_COMMUNITY)
Admission: AD | Admit: 2024-07-25 | Discharge: 2024-07-25 | Discharge status: Against Medical Advice | Attending: Obstetrics and Gynecology | Admitting: Obstetrics and Gynecology

## 2024-07-25 DIAGNOSIS — Z5321 Procedure and treatment not carried out due to patient leaving prior to being seen by health care provider: Secondary | ICD-10-CM | POA: Diagnosis not present

## 2024-07-25 DIAGNOSIS — Z3201 Encounter for pregnancy test, result positive: Secondary | ICD-10-CM | POA: Diagnosis present

## 2024-07-25 DIAGNOSIS — Z3A01 Less than 8 weeks gestation of pregnancy: Secondary | ICD-10-CM | POA: Diagnosis not present

## 2024-07-25 DIAGNOSIS — O039 Complete or unspecified spontaneous abortion without complication: Secondary | ICD-10-CM | POA: Insufficient documentation

## 2024-07-25 LAB — URINALYSIS, ROUTINE W REFLEX MICROSCOPIC
Bilirubin Urine: NEGATIVE
Glucose, UA: NEGATIVE mg/dL
Ketones, ur: 5 mg/dL — AB
Nitrite: NEGATIVE
Protein, ur: 30 mg/dL — AB
Specific Gravity, Urine: 1.025 (ref 1.005–1.030)
pH: 6 (ref 5.0–8.0)

## 2024-07-25 LAB — CBC
HCT: 42.6 % (ref 36.0–46.0)
Hemoglobin: 14.2 g/dL (ref 12.0–15.0)
MCH: 28.6 pg (ref 26.0–34.0)
MCHC: 33.3 g/dL (ref 30.0–36.0)
MCV: 85.7 fL (ref 80.0–100.0)
Platelets: 218 K/uL (ref 150–400)
RBC: 4.97 MIL/uL (ref 3.87–5.11)
RDW: 13.7 % (ref 11.5–15.5)
WBC: 4.9 K/uL (ref 4.0–10.5)
nRBC: 0 % (ref 0.0–0.2)

## 2024-07-25 LAB — HCG, QUANTITATIVE, PREGNANCY: hCG, Beta Chain, Quant, S: 1629 m[IU]/mL — ABNORMAL HIGH

## 2024-07-25 LAB — WET PREP, GENITAL
Clue Cells Wet Prep HPF POC: NONE SEEN
Sperm: NONE SEEN
Trich, Wet Prep: NONE SEEN
WBC, Wet Prep HPF POC: >=10 — AB
Yeast Wet Prep HPF POC: NONE SEEN

## 2024-07-25 LAB — ABO/RH: ABO/RH(D): B POS

## 2024-07-25 LAB — POCT PREGNANCY, URINE: Preg Test, Ur: POSITIVE — AB

## 2024-07-25 NOTE — Progress Notes (Signed)
 Pt left AMA prior to being triaged and seen by provider.

## 2024-07-25 NOTE — MAU Provider Note (Signed)
 Chief Complaint:  Vaginal Bleeding  HPI  Connie Dominguez is a 24 y.o. G1P0 at [redacted]w[redacted]d who presents to maternity admissions reporting positive UPT 07/01/24 but continued bleeding since that day. Bleeding has been intermittently light or heavy but continuous since she found out she was pregnant. Of note, LMP: 06/24/24.  Was told to come to MAU for evaluation on the 13th. Denies any additional symptoms.   Pregnancy Course: Was seen at [facility] and had the following serial quants: 12/23 - 308 1/6 - 2033 1/13 - 2661  Past Medical History:  Diagnosis Date   Acute pain of left knee 05/21/2017   ADHD (attention deficit hyperactivity disorder)    Allergy    ODD (oppositional defiant disorder)    Vision abnormalities    wears glasses   OB History  Gravida Para Term Preterm AB Living  1       SAB IAB Ectopic Multiple Live Births          # Outcome Date GA Lbr Len/2nd Weight Sex Type Anes PTL Lv  1 Current            Past Surgical History:  Procedure Laterality Date   ANTERIOR CRUCIATE LIGAMENT REPAIR Left 06/28/2017   Procedure: LEFT KNEE ANTERIOR CRUCIATE LIGAMENT (ACL) RECONSTRUCTION WITH HAMSTRING AUTOGRAFT, MEDIAL MENISCAL REPAIR;  Surgeon: Addie Cordella Hamilton, MD;  Location: [facility] OR;  Service: Orthopedics;  Laterality: Left;   WISDOM TOOTH EXTRACTION     Family History  Problem Relation Age of Onset   Cerebral palsy Mother    Social History[1] Allergies[2] No medications prior to admission.   I have reviewed patient's Past Medical Hx, Surgical Hx, Family Hx, Social Hx, medications and allergies.   ROS  Pertinent items noted in HPI and remainder of comprehensive ROS otherwise negative.   PHYSICAL EXAM  Patient Vitals for the past 24 hrs:  BP Temp Temp src Pulse Resp SpO2 Height Weight  07/25/24 1625 139/69 99 F (37.2 C) Oral 70 20 100 % 5' 5.25 (1.657 m) 123 lb 14.4 oz (56.2 kg)   Constitutional: Well-developed, well-nourished female in no acute distress.   Cardiovascular: normal rate & rhythm, warm and well-perfused Respiratory: normal effort, no problems with respiration noted GI: Abd soft, non-tender, non-distended MS: Extremities nontender, no edema, normal ROM Neurologic: Alert and oriented x 4.  GU: no CVA tenderness Pelvic:  exam deferred, sent for ectopic workup   Labs: Results for orders placed or performed during the hospital encounter of 07/25/24 (from the past 24 hours)  ABO/Rh     Status: None   Collection Time: 07/25/24  4:55 PM  Result Value Ref Range   ABO/RH(D) B POS    No rh immune globuloin      NOT A RH IMMUNE GLOBULIN CANDIDATE, PT RH POSITIVE Performed at [facility], [address] [place], [place] [address]   CBC     Status: None   Collection Time: 07/25/24  4:57 PM  Result Value Ref Range   WBC 4.9 4.0 - 10.5 K/uL   RBC 4.97 3.87 - 5.11 MIL/uL   Hemoglobin 14.2 12.0 - 15.0 g/dL   HCT 57.3 63.9 - 53.9 %   MCV 85.7 80.0 - 100.0 fL   MCH 28.6 26.0 - 34.0 pg   MCHC 33.3 30.0 - 36.0 g/dL   RDW 86.2 88.4 - 84.4 %   Platelets 218 150 - 400 K/uL   nRBC 0.0 0.0 - 0.2 %  hCG, quantitative, pregnancy  Status: Abnormal   Collection Time: 07/25/24  4:57 PM  Result Value Ref Range   hCG, Beta Chain, Quant, S 1,629 (H) <5 mIU/mL   Imaging:  US  OB LESS THAN 14 WEEKS WITH OB TRANSVAGINAL Result Date: 07/25/2024 CLINICAL DATA:  Vaginal bleeding. EXAM: OBSTETRIC <14 WK US  AND TRANSVAGINAL OB US  TECHNIQUE: Both transabdominal and transvaginal ultrasound examinations were performed for complete evaluation of the gestation as well as the maternal uterus, adnexal regions, and pelvic cul-de-sac. Transvaginal technique was performed to assess early pregnancy. COMPARISON:  None Available. FINDINGS: Intrauterine gestational sac: None Yolk sac:  Not Visualized. Embryo:  Not Visualized. Cardiac Activity: Not Visualized. Heart Rate: N/A  bpm Maternal uterus/adnexae: The right ovary measures 3.3 cm x 1.7 cm x 2.3 cm (volume  6.54 mL) and is normal in appearance. The left ovary measures 3.1 cm x 1.7 cm x 1.7 cm (volume 4.41 mm) and contains a small corpus luteum cyst (ultrasonographic measurements not provided). A trace amount of pelvic free fluid is seen. IMPRESSION: 1. No evidence of an intrauterine pregnancy. Correlation with follow-up pelvic ultrasound and serial beta HCG levels is recommended if this remains of clinical concern. Electronically Signed   By: Suzen Dials M.D.   On: 07/25/2024 18:13   MDM & MAU COURSE  MDM: Moderate  MAU Course: Orders Placed This Encounter  Procedures   US  OB LESS THAN 14 WEEKS WITH OB TRANSVAGINAL   CBC   hCG, quantitative, pregnancy   ABO/Rh   Discharge patient   No orders of the defined types were placed in this encounter.  Reviewed course of bleeding/quants with patient Ran full ectopic workup which shows drop in HCG and nothing in uterus Reviewed presentation/findings with Dr. Abigail who agreed this is a failed anembryonic pregnancy Reviewed diagnosis with patient and answered questions Advised pt see [facility] for repeat quant on Thursday or Friday to make sure it continues to drop accordingly  ASSESSMENT   1. Miscarriage    PLAN  Discharge home in stable condition with return (bleeding) precautions.    Follow-up Information     Gynecology, [facility] up in 1 week(s).   Specialty: Obstetrics and Gynecology Why: To repeat HCG (blood work) Contact information: [address] [place] [address] [phone]                 Allergies as of 07/25/2024   No Known Allergies      Medication List     STOP taking these medications    chlorhexidine  4 % external liquid Commonly known as: HIBICLENS    doxycycline 100 MG EC tablet Commonly known as: DORYX       Cornell Finder, CNM, MSN, IBCLC Certified Nurse Midwife, [facility]        [1]  Social History Tobacco Use   Smoking status:  Never   Smokeless tobacco: Never  Vaping Use   Vaping status: Every Day  Substance Use Topics   Alcohol use: Yes    Alcohol/week: 21.0 standard drinks of alcohol    Types: 21 Shots of liquor per week   Drug use: Yes    Types: Marijuana  [2] No Known Allergies

## 2024-07-25 NOTE — MAU Note (Addendum)
 Connie Dominguez is a 24 y.o. at Unknown here in MAU reporting: she's been having light VB since 07/01/2024.  Denies pain.  Reports +UPT at [facility] in addition to having a HCG 2661 on 06/21/2025.  Reports she was instructed to follow up in MAU to speak with an OB.  LMP: 06/24/2024 Onset of complaint: 07/01/2024 Pain score: 0 Vitals:   07/25/24 1625  BP: 139/69  Pulse: 70  Resp: 20  Temp: 99 F (37.2 C)  SpO2: 100%     FHT: NA  Lab orders placed from triage: None

## 2024-07-28 LAB — GC/CHLAMYDIA PROBE AMP (~~LOC~~) NOT AT ARMC
Chlamydia: NEGATIVE
Comment: NEGATIVE
Comment: NORMAL
Neisseria Gonorrhea: NEGATIVE
